# Patient Record
Sex: Female | Born: 1950 | Race: White | Hispanic: No | Smoking: Current every day smoker
Health system: Southern US, Community
[De-identification: ages and names within clinical notes are randomized; demographics above are authoritative.]

---

## 2005-10-18 ENCOUNTER — Ambulatory Visit (HOSPITAL_COMMUNITY): Admission: RE | Admit: 2005-10-18 | Discharge: 2005-10-18 | Payer: Self-pay | Admitting: Emergency Medicine

## 2005-10-18 ENCOUNTER — Emergency Department (HOSPITAL_COMMUNITY): Admission: EM | Admit: 2005-10-18 | Discharge: 2005-10-18 | Payer: Self-pay | Admitting: Emergency Medicine

## 2006-03-30 IMAGING — CR DG FEMUR 2+V*R*
4 series · 4 of 4 positions shown · non-contrast
Comparison: none

CLINICAL DATA: Pain without injury. 
 RIGHT FEMUR ? 4 VIEW:

[t femur with hip  ap right]
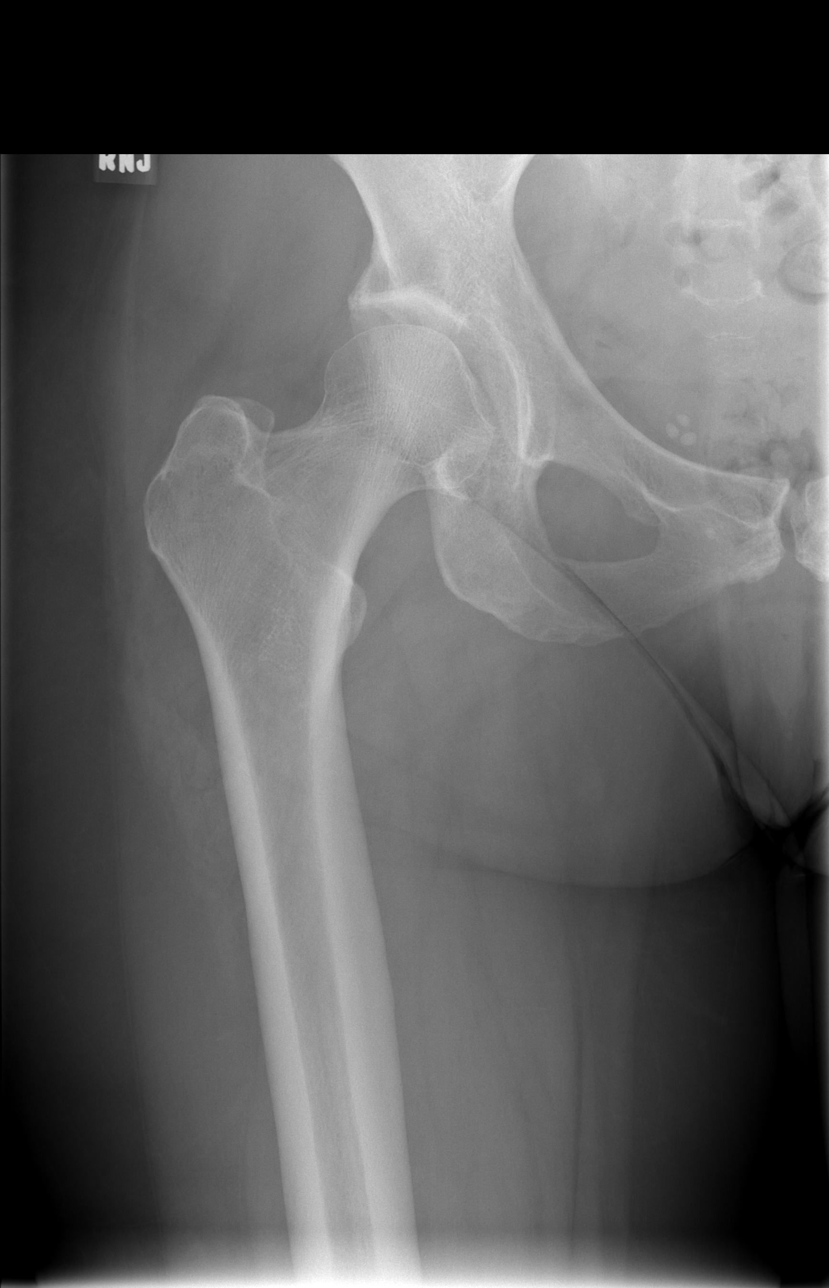

[t femur with knee ap right]
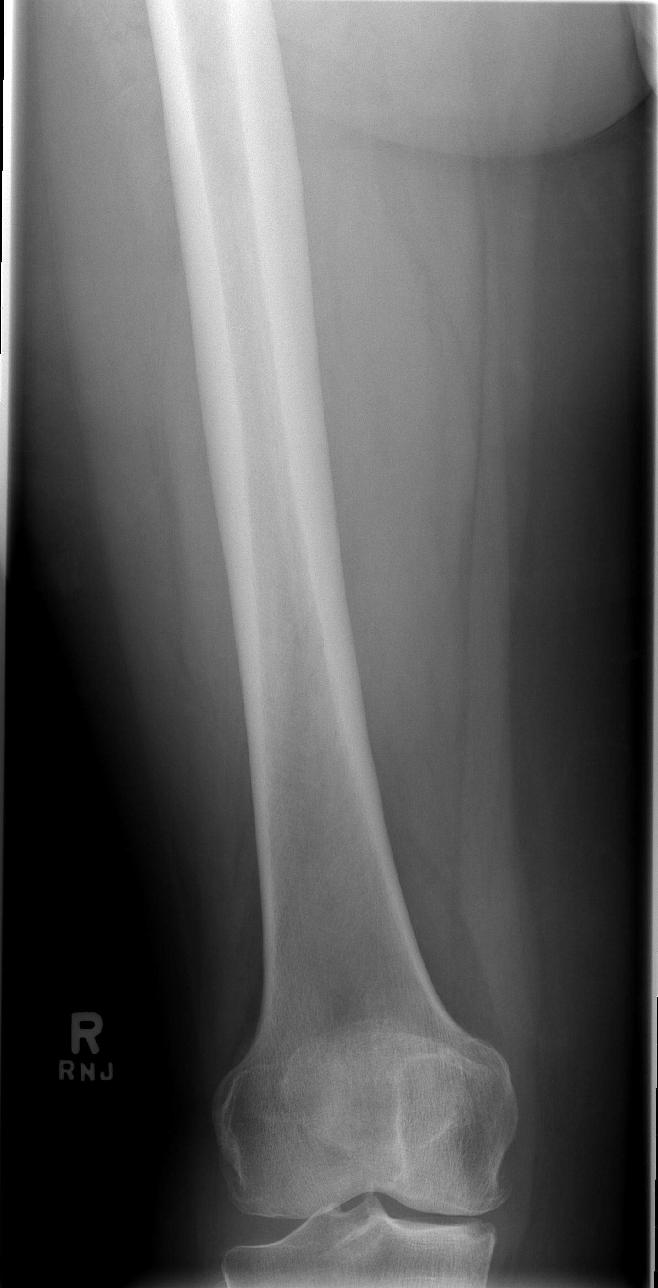

[t femur with hip lat right]
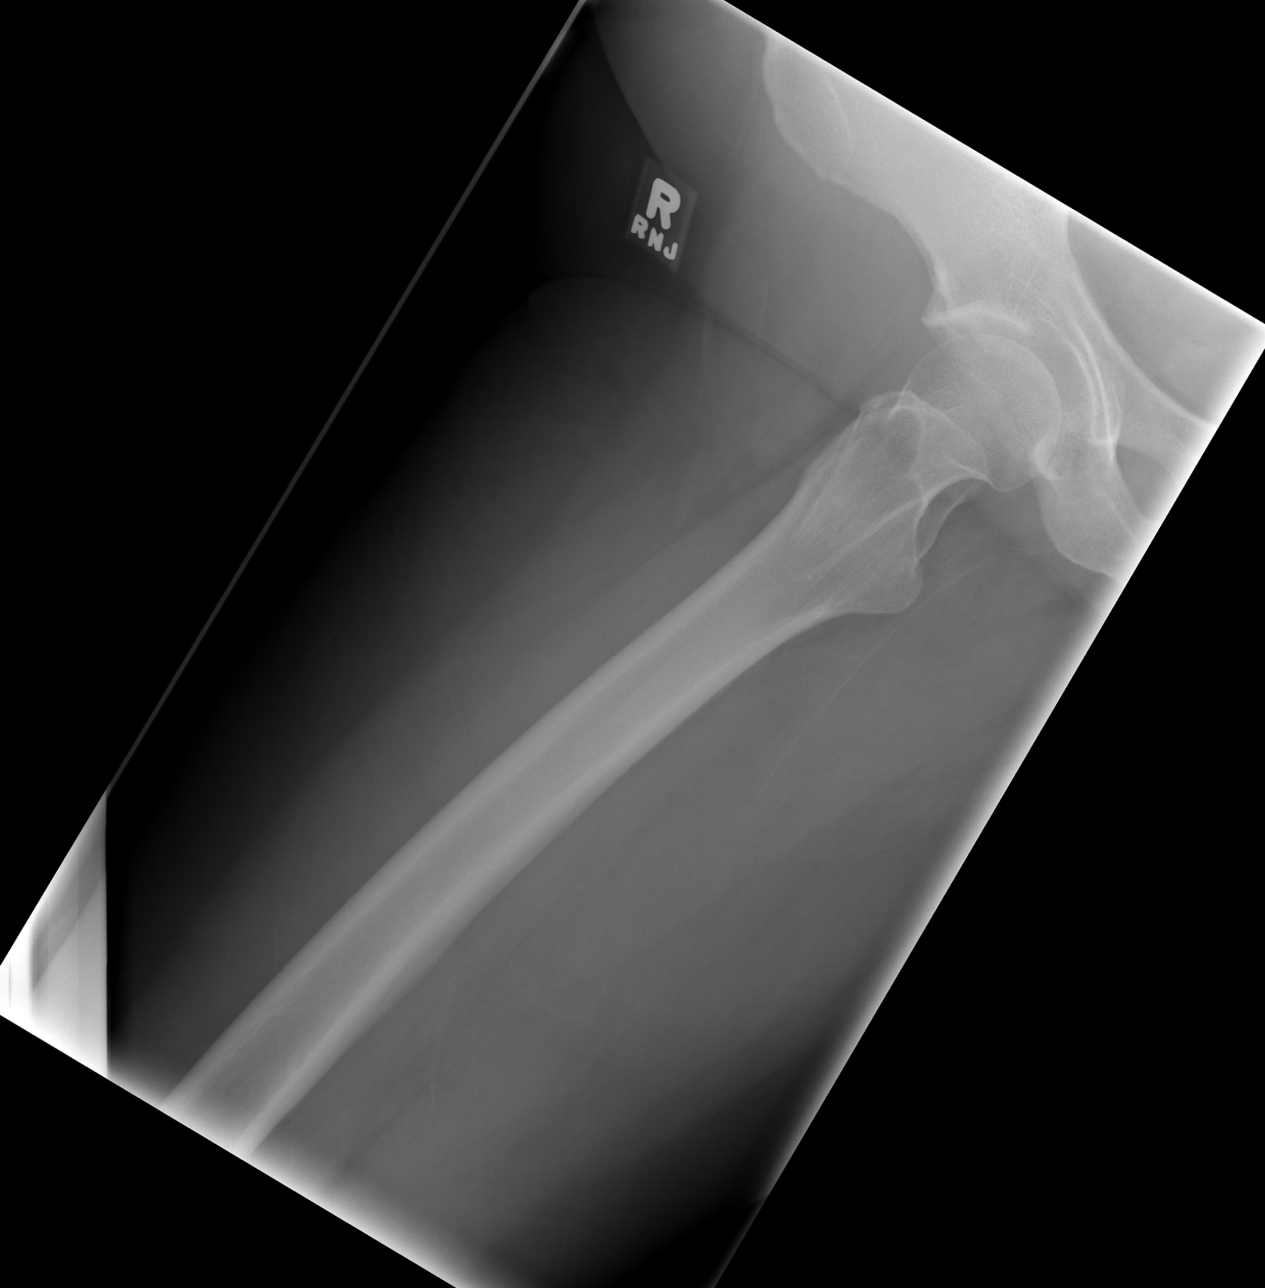

[t femur with knee lat right]
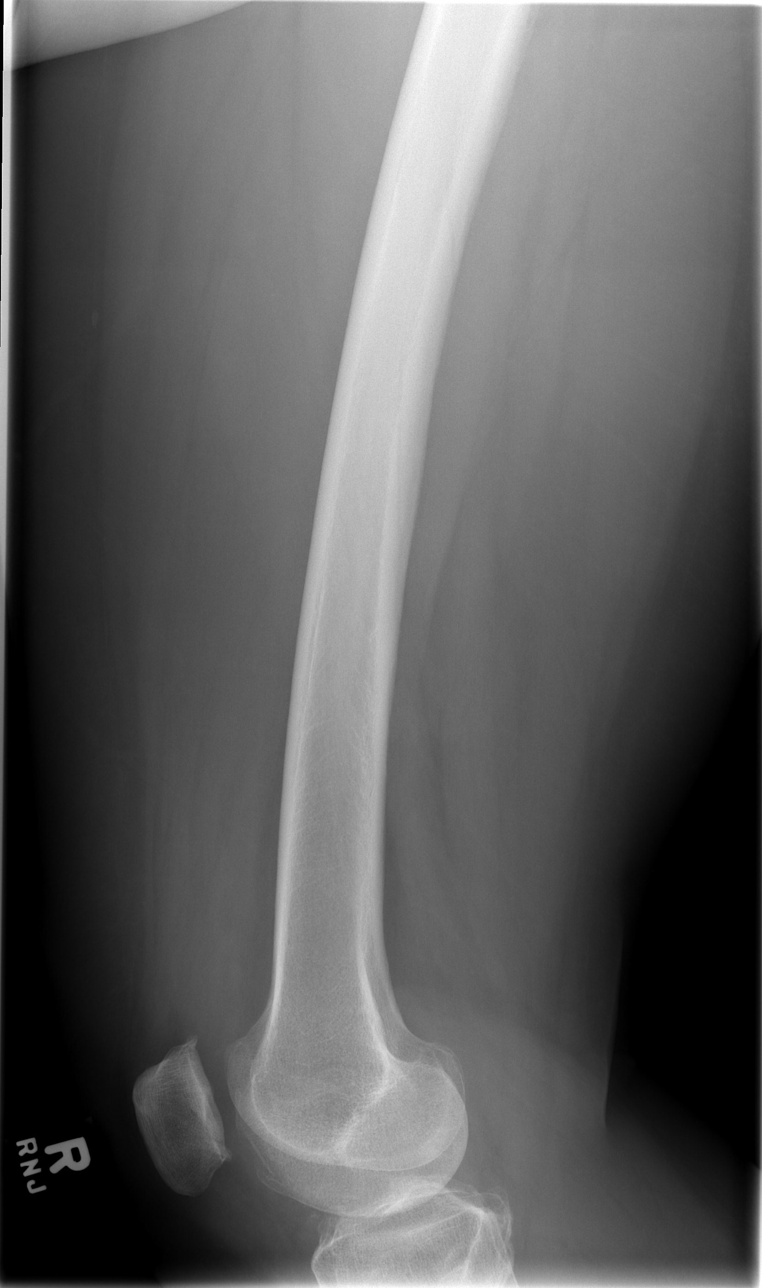

[4 of 4 positions shown; findings below may reference images not displayed]

FINDINGS: No acute bony or joint abnormality is identified.  Some degenerative disease about the knee most notable in the medial compartment is seen.
IMPRESSION: No acute finding.

## 2007-07-11 DIAGNOSIS — K219 Gastro-esophageal reflux disease without esophagitis: Secondary | ICD-10-CM | POA: Insufficient documentation

## 2013-04-23 DIAGNOSIS — K439 Ventral hernia without obstruction or gangrene: Secondary | ICD-10-CM | POA: Insufficient documentation

## 2014-05-11 DIAGNOSIS — M1710 Unilateral primary osteoarthritis, unspecified knee: Secondary | ICD-10-CM | POA: Insufficient documentation

## 2014-05-11 DIAGNOSIS — M179 Osteoarthritis of knee, unspecified: Secondary | ICD-10-CM | POA: Insufficient documentation

## 2015-12-08 ENCOUNTER — Encounter (HOSPITAL_COMMUNITY): Payer: Self-pay | Admitting: Psychiatry

## 2015-12-08 ENCOUNTER — Ambulatory Visit (INDEPENDENT_AMBULATORY_CARE_PROVIDER_SITE_OTHER): Payer: Medicare HMO | Admitting: Psychiatry

## 2015-12-08 VITALS — BP 124/68 | HR 56 | Ht 65.0 in | Wt 210.0 lb

## 2015-12-08 DIAGNOSIS — F411 Generalized anxiety disorder: Secondary | ICD-10-CM | POA: Diagnosis not present

## 2015-12-08 DIAGNOSIS — F332 Major depressive disorder, recurrent severe without psychotic features: Secondary | ICD-10-CM | POA: Diagnosis not present

## 2015-12-08 DIAGNOSIS — F0630 Mood disorder due to known physiological condition, unspecified: Secondary | ICD-10-CM | POA: Diagnosis not present

## 2015-12-08 DIAGNOSIS — F1320 Sedative, hypnotic or anxiolytic dependence, uncomplicated: Secondary | ICD-10-CM

## 2015-12-08 MED ORDER — GABAPENTIN 100 MG PO CAPS: 100.0000 mg | ORAL_CAPSULE | Freq: Three times a day (TID) | ORAL | Status: AC

## 2015-12-08 MED ORDER — SERTRALINE HCL 100 MG PO TABS: 100.0000 mg | ORAL_TABLET | Freq: Two times a day (BID) | ORAL | Status: AC

## 2015-12-08 NOTE — Progress Notes (Signed)
Psychiatric Initial Adult Assessment   Patient Identification: Sarah Yates Carda MRN:  [id number] Date of Evaluation:  12/08/2015 Referral Source: [facility] admission February 2017 Chief Complaint:   Chief Complaint    Establish Care     Visit Diagnosis:    ICD-9-CM ICD-10-CM   1. Mood disorder in conditions classified elsewhere 293.83 F06.30   2. Severe episode of recurrent major depressive disorder, without psychotic features (HCC) 296.33 F33.2   3. GAD (generalized anxiety disorder) 300.02 F41.1   4. Benzodiazepine dependence (HCC) 304.10 F13.20    Diagnosis:   Patient Active Problem List   Diagnosis Date Noted  . Arthritis of knee, degenerative [M17.9] 05/11/2014  . Epigastric hernia [K43.9] 04/23/2013  . Arthritis, degenerative [M19.90] 01/07/2009  . Acid reflux [K21.9] 07/11/2007   History of Present Illness:  65 years old White female single [facility] . Admitted for benzo withdrawal. ( has been on xanax tid for more then 10 years ) Patient here with her daughter .  She has had history of alcoholism in the past last use is in 2000. Says that her provider was giving her Xanax and wants his practice was taken over somebody else they stopped all the narcotic medication and she went through withdrawal.   patient during the hospital stay has been started on Zoloft., Trazodone, gabapentin. Apparently she is having some nausea with the gabapentin says that trazodone does not help her sleep. Endorses anxiety excessive worries, she was about her daughter she was about physical health. She has been diagnosed with possible mood swings not sure about bipolar in the past. Says that she has been on different medications including Depakote and all that junk but she does not want to be back on that. She has not drank any alcohol for the last 7 years she has prior history of alcohol use and alcohol admission for relapse and detox. Aggravating factor: worries, poor  sleep Modifying factor: family support, daughter comes by . Has pets  Location:  Depression, mood swings, anxiety Context: alcohol use in past . Poor coping skills. Duration: 20 years or  More  severity of depression: 4/10 . 10 being no depression. Associated Signs/Symptoms: Depression Symptoms:  anhedonia, insomnia, impaired memory, anxiety, loss of energy/fatigue, disturbed sleep, (Hypo) Manic Symptoms:  Distractibility, Labiality of Mood, Anxiety Symptoms:  Excessive Worry, Psychotic Symptoms:  denies PTSD Symptoms: Had a traumatic exposure:  poor growing up and in childrens home.  Hypervigilance:  Yes Past Psychiatric History:  She has been diagnosed with possible mood swings not sure about bipolar in the past. Says that she has been on different medications including Depakote and all that junk but she does not want to be back on that. She has not drank any alcohol for the last 7 years she has prior history of alcohol use and alcohol admission for relapse and detox.  3 hospital admissions in the past. [facility] and [facility]  she also has [facility] because of alcohol addiction and went through alcohol detox  Past Medical History: History reviewed. No pertinent past medical history. History reviewed. No pertinent past surgical history. Family History:  Family History  Problem Relation Age of Onset  . Alcohol abuse Mother   . Alcohol abuse Father   . Alcohol abuse Brother   . Drug abuse Brother    Social History:   Social History   Social History  . Marital Status: Divorced    Spouse  Name: N/A  . Number of Children: N/A  . Years of Education: N/A   Social History Main Topics  . Smoking status: Current Every Day Smoker -- 1.00 packs/day    Types: Cigarettes  . Smokeless tobacco: Never Used  . Alcohol Use: No  . Drug Use: No  . Sexual Activity: Not Currently   Other Topics Concern  . None   Social History  Narrative  . None   Additional Social History:  Patient grew up in children home. Mom had 3 kids she was not able take care of the responsibility. Dad left and came back after 30 years according to her. It was rough and poor growing up. She did finish her GED later. 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 She has been married 2 times she left her second husband because of alcohol related issues. Has 3 daughters and they are close with her.  Currently lives by herself.  Musculoskeletal: Strength & Muscle Tone: within normal limits Gait & Station: normal Patient leans: N/A  Psychiatric Specialty Exam: HPI  Review of Systems  Constitutional: Positive for malaise/fatigue. Negative for fever.  Cardiovascular: Negative for chest pain.  Skin: Negative for rash.  Neurological: Negative for tremors.  Psychiatric/Behavioral: Positive for depression. Negative for suicidal ideas and substance abuse. The patient is nervous/anxious.     Blood pressure 124/68, pulse 56, height  (1.651 m), weight 210 lb (95.255 kg), SpO2 94 %.Body mass index is 34.95 kg/(m^2).  General Appearance: Casual  Eye Contact:  Fair  Speech:  Normal Rate  Volume:  Normal  Mood:  Anxious and Dysphoric  Affect:  Congruent  Thought Process:  Coherent  Orientation:  Full (Time, Place, and Person)  Thought Content:  Rumination  Suicidal Thoughts:  No  Homicidal Thoughts:  No  Memory:  Immediate;   Fair Recent;   Fair  Judgement:  Poor  Insight:  Shallow  Psychomotor Activity:  Normal  Concentration:  Fair  Recall:  [employer] of Knowledge:Fair  Language: Fair  Akathisia:  Negative  Handed:  Right  AIMS (if indicated):    Assets:  Desire for Improvement Social Support  ADL's:  Intact  Cognition: Impaired,  Mild  Sleep:  Variable to poor   Is the patient at risk to self?  No. Has the patient been a risk to self in the past 6 months?  Yes.   Has the patient been a risk to self within the distant  past?  Yes.   Is the patient a risk to others?  No. Has the patient been a risk to others in the past 6 months?  No. Has the patient been a risk to others within the distant past?  No.  Allergies:   Allergies  Allergen Reactions  . Other Itching and Rash   Current Medications: Current Outpatient Prescriptions  Medication Sig Dispense Refill  . gabapentin (NEURONTIN) 100 MG capsule Take 1 capsule (100 mg total) by mouth 3 (three) times daily. 90 capsule 0  . levothyroxine (SYNTHROID) 100 MCG tablet Take by mouth.    . pantoprazole (PROTONIX) 40 MG tablet Take by mouth.    . pravastatin (PRAVACHOL) 40 MG tablet Take by mouth.    . sertraline (ZOLOFT) 100 MG tablet Take 1 tablet (100 mg total) by mouth 2 (two) times daily. 60 tablet 0  . [DISCONTINUED] traZODone (DESYREL) 50 MG tablet Take 50 mg by mouth at bedtime.      No current  facility-administered medications for this visit.    Previous Psychotropic Medications: Yes  Depakote and according to her all that junk for bipolar Trazadone; dry mouth Xanax: went thru withdrawals  Substance Abuse History in the last 12 months:  Yes.    Consequences of Substance Abuse: Medical Consequences:  withdrawal from xanax Withdrawal Symptoms:   anxiety  Medical Decision Making:  Review of Psycho-Social Stressors (1), Independent Review of image, tracing or specimen (2) and Review of Medication Regimen & Side Effects (2)  Treatment Plan Summary: Medication management and Plan as follows  Mood disorder possible Bipolar as per history: may be related to alcohol use or recurrent depression. Advised to continue  Gabapentin since she is not willing to start something different or actually was wanted to stop the gabapentin so we will lower the dose to 1 mg 3 times a day.  If that does not work we will consider Trileptal or some other medication for mood swings  generalized anxiety disorder; increase Zoloft to 100 mg twice a day  insomnia; review  sleep hygiene, trazodone causes a dry mouth discontinue trazodone consider Neurontin at night  depression; Zoloft 200 mg a day  we reviewed psychosocial issues and also to have more positive attitude during the day and have a structured daytime schedule so that she can spend some  Time outside and also reading a book or walking around and having positive things to do  more than 50% time spent in counseling and coordination of care including patient education. Consider therapy for psychosocial issues along with medication management.   patient is not suicidal. Patient was here with her daughter daughter agrees with the plan  with using any benzodiazepine. Patient is not dressed back into alcohol for the last 7 years relapse prevention reviewed  call 911 or report local emergency room for any urgent concerns of suicidal thoughts Follow up 3-4 weeks   Rayden Scheper 3/16/20179:33 AM

## 2015-12-28 ENCOUNTER — Ambulatory Visit (HOSPITAL_COMMUNITY): Payer: Self-pay | Admitting: Psychiatry
